# Patient Record
Sex: Male | Born: 1990 | Race: Black or African American | Hispanic: No | Marital: Single | Smoking: Current every day smoker
Health system: Southern US, Community
[De-identification: ages and names within clinical notes are randomized; demographics above are authoritative.]

---
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
## 2010-06-07 ENCOUNTER — Emergency Department: Payer: Self-pay | Admitting: Emergency Medicine

## 2012-06-04 ENCOUNTER — Emergency Department (HOSPITAL_COMMUNITY)
Admission: EM | Admit: 2012-06-04 | Discharge: 2012-06-04 | Disposition: A | Discharge status: Home / Self Care | Payer: BC Managed Care – PPO | Attending: Emergency Medicine | Admitting: Emergency Medicine

## 2012-06-04 ENCOUNTER — Encounter (HOSPITAL_COMMUNITY): Payer: Self-pay | Admitting: *Deleted

## 2012-06-04 DIAGNOSIS — Y92410 Unspecified street and highway as the place of occurrence of the external cause: Secondary | ICD-10-CM | POA: Insufficient documentation

## 2012-06-04 DIAGNOSIS — S20219A Contusion of unspecified front wall of thorax, initial encounter: Secondary | ICD-10-CM | POA: Insufficient documentation

## 2012-06-04 DIAGNOSIS — Z79899 Other long term (current) drug therapy: Secondary | ICD-10-CM | POA: Insufficient documentation

## 2012-06-04 DIAGNOSIS — S0990XA Unspecified injury of head, initial encounter: Secondary | ICD-10-CM | POA: Insufficient documentation

## 2012-06-04 DIAGNOSIS — S20212A Contusion of left front wall of thorax, initial encounter: Secondary | ICD-10-CM

## 2012-06-04 DIAGNOSIS — S161XXA Strain of muscle, fascia and tendon at neck level, initial encounter: Secondary | ICD-10-CM

## 2012-06-04 DIAGNOSIS — Y9389 Activity, other specified: Secondary | ICD-10-CM | POA: Insufficient documentation

## 2012-06-04 DIAGNOSIS — S139XXA Sprain of joints and ligaments of unspecified parts of neck, initial encounter: Secondary | ICD-10-CM | POA: Insufficient documentation

## 2012-06-04 MED ORDER — IBUPROFEN 800 MG PO TABS: 800.0000 mg | ORAL_TABLET | Freq: Three times a day (TID) | ORAL | Status: DC | PRN

## 2012-06-04 MED ORDER — IBUPROFEN 800 MG PO TABS
800.0000 mg | ORAL_TABLET | Freq: Once | ORAL | Status: AC
  Administered 2012-06-04: 800 mg via ORAL
  Filled 2012-06-04: qty 1

## 2012-06-04 MED ORDER — OXYCODONE-ACETAMINOPHEN 5-325 MG PO TABS: 1.0000 | ORAL_TABLET | Freq: Four times a day (QID) | ORAL | Status: DC | PRN

## 2012-06-04 MED ORDER — DIAZEPAM 5 MG PO TABS: 5.0000 mg | ORAL_TABLET | Freq: Two times a day (BID) | ORAL | Status: DC
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
## 2012-06-04 NOTE — ED Provider Notes (Signed)
Medical screening examination/treatment/procedure(s) were performed by non-physician practitioner and as supervising physician I was immediately available for consultation/collaboration.  Juliet Rude. Rubin Payor, MD 06/04/12 [phone]

## 2013-09-08 ENCOUNTER — Encounter (HOSPITAL_COMMUNITY): Payer: Self-pay | Admitting: Emergency Medicine

## 2013-09-08 ENCOUNTER — Emergency Department (HOSPITAL_COMMUNITY)
Admission: EM | Admit: 2013-09-08 | Discharge: 2013-09-08 | Disposition: A | Discharge status: Home / Self Care | Payer: Managed Care, Other (non HMO) | Attending: Emergency Medicine | Admitting: Emergency Medicine

## 2013-09-08 DIAGNOSIS — Z79899 Other long term (current) drug therapy: Secondary | ICD-10-CM | POA: Insufficient documentation

## 2013-09-08 DIAGNOSIS — S058X9A Other injuries of unspecified eye and orbit, initial encounter: Secondary | ICD-10-CM | POA: Insufficient documentation

## 2013-09-08 DIAGNOSIS — Y939 Activity, unspecified: Secondary | ICD-10-CM | POA: Insufficient documentation

## 2013-09-08 DIAGNOSIS — IMO0002 Reserved for concepts with insufficient information to code with codable children: Secondary | ICD-10-CM | POA: Insufficient documentation

## 2013-09-08 DIAGNOSIS — Y929 Unspecified place or not applicable: Secondary | ICD-10-CM | POA: Insufficient documentation

## 2013-09-08 DIAGNOSIS — S0501XA Injury of conjunctiva and corneal abrasion without foreign body, right eye, initial encounter: Secondary | ICD-10-CM

## 2013-09-08 MED ORDER — HYDROCODONE-ACETAMINOPHEN 5-325 MG PO TABS
2.0000 | ORAL_TABLET | Freq: Once | ORAL | Status: AC
  Administered 2013-09-08: 2 via ORAL
  Filled 2013-09-08: qty 2

## 2013-09-08 MED ORDER — CIPROFLOXACIN HCL 0.3 % OP SOLN
2.0000 [drp] | OPHTHALMIC | Status: DC
  Administered 2013-09-08: 2 [drp] via OPHTHALMIC
  Filled 2013-09-08: qty 2.5

## 2013-09-08 MED ORDER — IBUPROFEN 800 MG PO TABS
800.0000 mg | ORAL_TABLET | Freq: Once | ORAL | Status: AC
  Administered 2013-09-08: 800 mg via ORAL
  Filled 2013-09-08: qty 1

## 2013-09-08 MED ORDER — HYDROCODONE-ACETAMINOPHEN 5-325 MG PO TABS: 2.0000 | ORAL_TABLET | ORAL | Status: DC | PRN

## 2013-09-08 MED ORDER — PROPARACAINE HCL 0.5 % OP SOLN
2.0000 [drp] | Freq: Once | OPHTHALMIC | Status: AC
  Administered 2013-09-08: 2 [drp] via OPHTHALMIC
  Filled 2013-09-08: qty 15

## 2013-09-08 MED ORDER — FLUORESCEIN SODIUM 1 MG OP STRP
1.0000 | ORAL_STRIP | Freq: Once | OPHTHALMIC | Status: AC
Start: 2013-09-08 — End: 2013-09-08
  Administered 2013-09-08: 1 via OPHTHALMIC
  Filled 2013-09-08: qty 1

## 2013-09-08 NOTE — Discharge Instructions (Signed)
Take Tylenol and Ibuprofen for pain as needed For severe pain take norco or vicodin however realize they have the potential for addiction and it can make you sleepy and has tylenol in it.  No operating machinery while taking. See an ophthalmologist in 2 days for recheck.  If you were given medicines take as directed.  If you are on coumadin or contraceptives realize their levels and effectiveness is altered by many different medicines.  If you have any reaction (rash, tongues swelling, other) to the medicines stop taking and see a physician.   Please follow up as directed and return to the ER or see a physician for new or worsening symptoms.  Thank you. Filed Vitals:   09/08/13 0230  BP: 151/72  Pulse: 85  Temp: 98.2 F (36.8 C)  TempSrc: Oral  Height: 5\' 9"  (1.753 m)  Weight: 160 lb (72.576 kg)  SpO2: 100%    Corneal Abrasion The cornea is the clear covering at the front and center of the eye. When looking at the colored portion of the eye (iris), you are looking through the cornea. This very thin tissue is made up of many layers. The surface layer is a single layer of cells (corneal epithelium) and is one of the most sensitive tissues in the body. If a scratch or injury causes the corneal epithelium to come off, it is called a corneal abrasion. If the injury extends to the tissues below the epithelium, the condition is called a corneal ulcer. CAUSES   Scratches.  Trauma.  Foreign body in the eye. Some people have recurrences of abrasions in the area of the original injury even after it has healed (recurrent erosion syndrome). Recurrent erosion syndrome generally improves and goes away with time. SYMPTOMS   Eye pain.  Difficulty or inability to keep the injured eye open.  The eye becomes very sensitive to light.  Recurrent erosions tend to happen suddenly, first thing in the morning, usually after waking up and opening the eye. DIAGNOSIS  Your health care provider can diagnose  a corneal abrasion during an eye exam. Dye is usually placed in the eye using a drop or a small paper strip moistened by your tears. When the eye is examined with a special light, the abrasion shows up clearly because of the dye. TREATMENT   Small abrasions may be treated with antibiotic drops or ointment alone.  A pressure patch may be put over the eye. If this is done, follow your doctor's instructions for when to remove the patch. Do not drive or use machines while the eye patch is on. Judging distances is hard to do with a patch on. If the abrasion becomes infected and spreads to the deeper tissues of the cornea, a corneal ulcer can result. This is serious because it can cause corneal scarring. Corneal scars interfere with light passing through the cornea and cause a loss of vision in the involved eye. HOME CARE INSTRUCTIONS  Use medicine or ointment as directed. Only take over-the-counter or prescription medicines for pain, discomfort, or fever as directed by your health care provider.  Do not drive or operate machinery if your eye is patched. Your ability to judge distances is impaired.  If your health care provider has given you a follow-up appointment, it is very important to keep that appointment. Not keeping the appointment could result in a severe eye infection or permanent loss of vision. If there is any problem keeping the appointment, let your health care provider know.  SEEK MEDICAL CARE IF:   You have pain, light sensitivity, and a scratchy feeling in one eye or both eyes.  Your pressure patch keeps loosening up, and you can blink your eye under the patch after treatment.  Any kind of discharge develops from the eye after treatment or if the lids stick together in the morning.  You have the same symptoms in the morning as you did with the original abrasion days, weeks, or months after the abrasion healed. MAKE SURE YOU:   Understand these instructions.  Will watch your  condition.  Will get help right away if you are not doing well or get worse. Document Released: 02/25/2000 Document Revised: 03/04/2013 Document Reviewed: 11/04/2012 [facility] Patient Information 2015 [place], [place]. This information is not intended to replace advice given to you by your health care provider. Make sure you discuss any questions you have with your health care provider.
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
## 2015-08-18 ENCOUNTER — Emergency Department (HOSPITAL_COMMUNITY)
Admission: EM | Admit: 2015-08-18 | Discharge: 2015-08-18 | Disposition: A | Discharge status: Home / Self Care | Payer: Managed Care, Other (non HMO) | Attending: Emergency Medicine | Admitting: Emergency Medicine

## 2015-08-18 ENCOUNTER — Emergency Department (HOSPITAL_COMMUNITY): Payer: Managed Care, Other (non HMO)

## 2015-08-18 ENCOUNTER — Encounter (HOSPITAL_COMMUNITY): Payer: Self-pay | Admitting: *Deleted

## 2015-08-18 DIAGNOSIS — Y939 Activity, unspecified: Secondary | ICD-10-CM | POA: Diagnosis not present

## 2015-08-18 DIAGNOSIS — S99921A Unspecified injury of right foot, initial encounter: Secondary | ICD-10-CM | POA: Diagnosis present

## 2015-08-18 DIAGNOSIS — M79671 Pain in right foot: Secondary | ICD-10-CM | POA: Diagnosis not present

## 2015-08-18 DIAGNOSIS — W3189XA Contact with other specified machinery, initial encounter: Secondary | ICD-10-CM | POA: Diagnosis not present

## 2015-08-18 DIAGNOSIS — Y990 Civilian activity done for income or pay: Secondary | ICD-10-CM | POA: Diagnosis not present

## 2015-08-18 DIAGNOSIS — Y929 Unspecified place or not applicable: Secondary | ICD-10-CM | POA: Insufficient documentation

## 2015-08-18 DIAGNOSIS — Z79899 Other long term (current) drug therapy: Secondary | ICD-10-CM | POA: Insufficient documentation

## 2015-08-18 MED ORDER — NAPROXEN 500 MG PO TABS: 500.0000 mg | ORAL_TABLET | Freq: Two times a day (BID) | ORAL | Status: DC

## 2015-08-18 NOTE — ED Notes (Signed)
Declined W/C at D/C and was escorted to lobby by RN. 

## 2015-08-18 NOTE — ED Notes (Addendum)
Pt reports his foot was injured at work this AM.  Pt works at [employer] and a 1000lbs container rolled on his foot this AM.
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
## 2018-11-04 ENCOUNTER — Ambulatory Visit (HOSPITAL_COMMUNITY)
Admission: EM | Admit: 2018-11-04 | Discharge: 2018-11-04 | Disposition: A | Discharge status: Home / Self Care | Payer: 59 | Attending: Emergency Medicine | Admitting: Emergency Medicine

## 2018-11-04 ENCOUNTER — Other Ambulatory Visit: Payer: Self-pay

## 2018-11-04 ENCOUNTER — Encounter (HOSPITAL_COMMUNITY): Payer: Self-pay

## 2018-11-04 ENCOUNTER — Ambulatory Visit (INDEPENDENT_AMBULATORY_CARE_PROVIDER_SITE_OTHER): Payer: 59

## 2018-11-04 DIAGNOSIS — K219 Gastro-esophageal reflux disease without esophagitis: Secondary | ICD-10-CM | POA: Diagnosis not present

## 2018-11-04 MED ORDER — FAMOTIDINE 20 MG PO TABS: 20.0000 mg | ORAL_TABLET | Freq: Two times a day (BID) | ORAL | 0 refills | Status: AC

## 2018-11-04 MED ORDER — PANTOPRAZOLE SODIUM 20 MG PO TBEC: 20.0000 mg | DELAYED_RELEASE_TABLET | Freq: Every day | ORAL | 0 refills | Status: AC

## 2018-11-04 NOTE — Discharge Instructions (Addendum)
Your x-ray was normal.  You do not have an infection or swelling in your throat such as epiglottitis.  I suspect that this is acid reflux.  Start the [employer] and Protonix.  Go to the ER for the signs and symptoms we discussed.  Follow-up with a primary care physician, see list below.  Below is a list of primary care practices who are taking new patients for you to follow-up with.  [facility] at [facility] [address] [place], [place] [address] [phone]  [place], [place] [address] [phone]  [facility] [phone] [place] [address]  [facility]: [place]  [phone]  [place] [facility]: [place]   [phone]  [facility]: [address] [place]  [phone]  [place] [facility] : [address] [place] [phone]  [facility]: [address]  [place] [phone]  [facility]: [place] [phone]  Dr. Blanchie Serve [place]  [phone]  Dr. Benito Mccreedy, [facility]. [place], [place] [address]  [phone]  Go to www.goodrx.com to look up your medications. This will give you a list of where you can find your prescriptions at the most affordable prices. Or ask the pharmacist what the cash price is, or if they have any other discount programs available to help make your medication more affordable. This can be less expensive than what you would pay with insurance.

## 2018-11-04 NOTE — ED Triage Notes (Signed)
Pt states he was SOB last night. Pt states he started to feel better this morning but it started back this afternoon.

## 2018-11-04 NOTE — ED Provider Notes (Signed)
HPI  SUBJECTIVE:  Lee Washington is a 28 y.o. male who presents with neck tightness, a sensation of his throat being squeezed with resulting shortness of breath starting last night.  Feels like he can get enough air into his lungs, but has difficulty breathing because he feels as if his throat is swelling.  He woke up without any symptoms this morning, but states they returned this afternoon.  He denies taking a large bite of food and not chewing well.  No fevers, body aches, headache, sore throat, tongue or lip swelling, voice changes, neck stiffness, drooling.  He states that he has a hard time swallowing saliva but has no problems with liquids or foods.  No burning chest pain, water brash.  No rash, hives, pruritus, coughing.  Mild nasal congestion, no postnasal drip.  No abdominal pain, diarrhea, nausea, vomiting.  No exposure to COVID.  No trauma to the neck.  No stridor.  He does not take any medications, specifically ACE inhibitors, on a regular basis.  He has had symptoms like this before but never sought medical evaluation for this.  There are no aggravating or alleviating factors.  It is not associated with eating, drinking, talking.  He has not tried anything for this.  Past medical history negative for GERD.  He is an occasional smoker.  No history of hereditary angioedema.  PMD: None.   History reviewed. No pertinent past medical history.  History reviewed. No pertinent surgical history.  History reviewed. No pertinent family history.  Social History   Tobacco Use  . Smoking status: Current Every Day Smoker    Types: Cigarettes  . Smokeless tobacco: Never Used  Substance Use Topics  . Alcohol use: No  . Drug use: No    No current facility-administered medications for this encounter.   Current Outpatient Medications:  .  famotidine (PEPCID) 20 MG tablet, Take 1 tablet (20 mg total) by mouth 2 (two) times daily., Disp: 40 tablet, Rfl: 0 .  pantoprazole (PROTONIX) 20 MG tablet,  Take 1 tablet (20 mg total) by mouth daily., Disp: 30 tablet, Rfl: 0 .  sertraline (ZOLOFT) 100 MG tablet, Take 100 mg by mouth every morning., Disp: , Rfl:   No Known Allergies   ROS  As noted in HPI.   Physical Exam  BP 135/74 (BP Location: Right Arm)   Pulse 74   Temp 98.6 F (37 C) (Oral)   Resp 18   Wt 90.7 kg   SpO2 100%   BMI 29.53 kg/m   Constitutional: Well developed, well nourished, no acute distress.  Normal voice. Eyes:  EOMI, conjunctiva normal bilaterally HENT: Normocephalic, atraumatic,mucus membranes moist.  No angioedema of the lips or tongue.  Airway widely patent.  Normal tonsils without exudates, uvula midline.  No drooling, stridor, trismus. Neck: No cervical lymphadenopathy, neck stiffness.  Normal appearance.  No tenderness of the larynx.   Respiratory: Normal inspiratory effort, lungs clear bilaterally. Cardiovascular: Normal rate GI: nondistended skin: No rash, skin intact Musculoskeletal: no deformities Neurologic: Alert & oriented x 3, no focal neuro deficits Psychiatric: Speech and behavior appropriate   ED Course   Medications - No data to display  Orders Placed This Encounter  Procedures  . DG Neck Soft Tissue    Standing Status:   Standing    Number of Occurrences:   1    Order Specific Question:   Reason for Exam (SYMPTOM  OR DIAGNOSIS REQUIRED)    Answer:   r/o epiglottitis, FB,  anatomic abnormality    No results found for this or any previous visit (from the past 24 hour(s)). Dg Neck Soft Tissue  Result Date: 11/04/2018 CLINICAL DATA:  28 year old male with shortness of breath, query epiglottitis. EXAM: NECK SOFT TISSUES - 1+ VIEW COMPARISON:  None. FINDINGS: Pharyngeal and laryngeal soft tissue contours are within normal limits, including the epiglottis. Visualized tracheal air column is within normal limits. The superficial soft tissue contours appear symmetric. Negative visible upper chest. No osseous abnormality identified.  IMPRESSION: Negative. Electronically Signed   By: Odessa FlemingH  Hall M.D.   On: 11/04/2018 14:56    ED Clinical Impression  1. Gastroesophageal reflux disease, esophagitis presence not specified      ED Assessment/Plan  We will get a lateral neck to rule out epiglottitis, although this is low in the differential with the normal voice and absence of sore throat.  Suspect GERD.  will start him on some Pepcid, Protonix.  Will provide a primary care list for ongoing care.  Gave him ER return precautions.  Reviewed imaging independently.  Normal.  See radiology report for full details.  Discussed  imaging, MDM, treatment plan, and plan for follow-up with patient. Discussed sn/sx that should prompt return to the ED. patient agrees with plan.   Meds ordered this encounter  Medications  . famotidine (PEPCID) 20 MG tablet    Sig: Take 1 tablet (20 mg total) by mouth 2 (two) times daily.    Dispense:  40 tablet    Refill:  0  . pantoprazole (PROTONIX) 20 MG tablet    Sig: Take 1 tablet (20 mg total) by mouth daily.    Dispense:  30 tablet    Refill:  0    *This clinic note was created using Scientist, clinical (histocompatibility and immunogenetics)Dragon dictation software. Therefore, there may be occasional mistakes despite careful proofreading.   ?   Domenick GongMortenson, Yeimy Brabant, MD 11/05/18 1207

## 2020-04-25 IMAGING — DX NECK SOFT TISSUES - 1+ VIEW
2 series · 2 of 2 positions shown · non-contrast
Comparison: None.

CLINICAL DATA: 27-year-old male with shortness of breath, query
epiglottitis.

EXAM:
NECK SOFT TISSUES - 1+ VIEW

[neck lat]
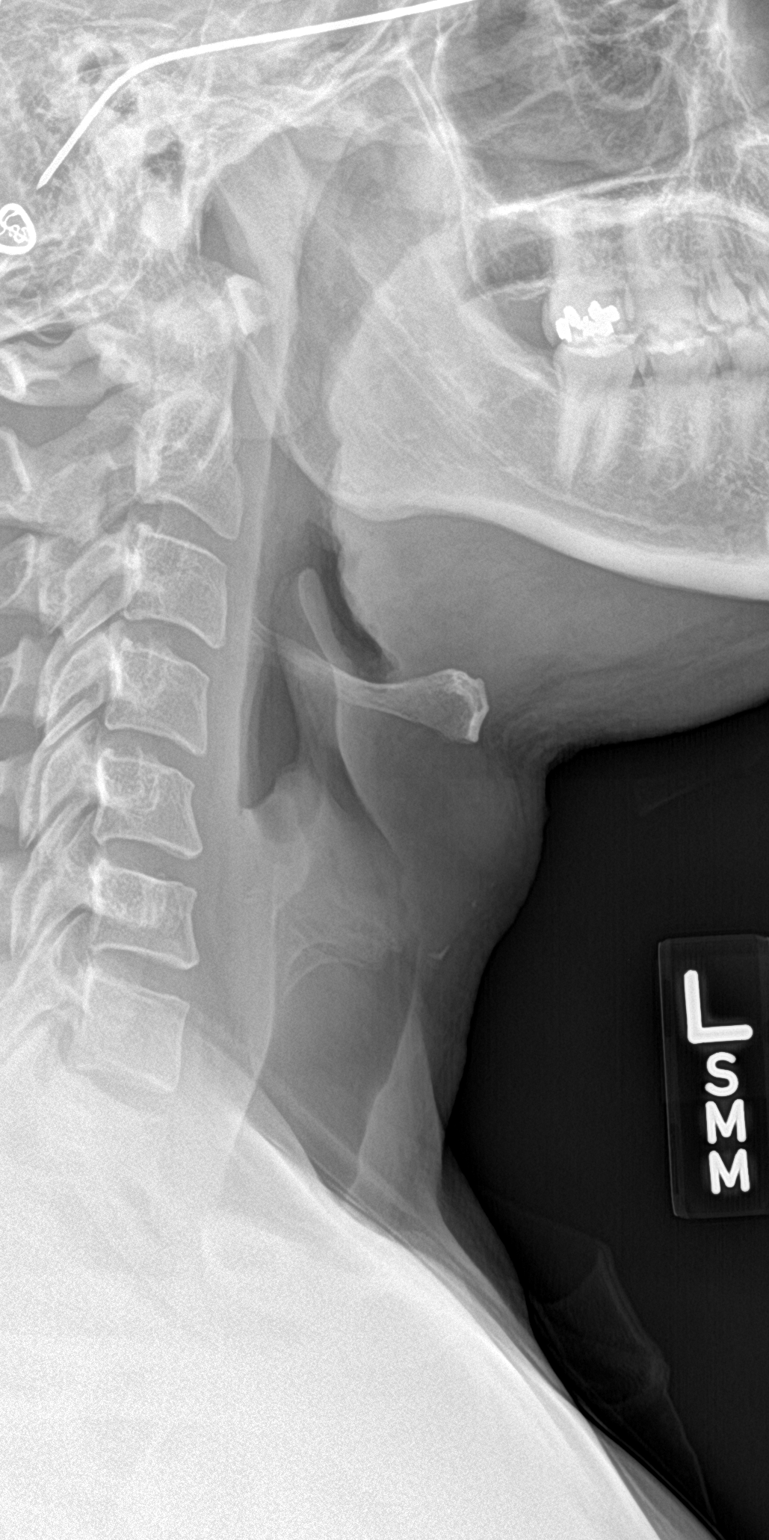

[neck ap]
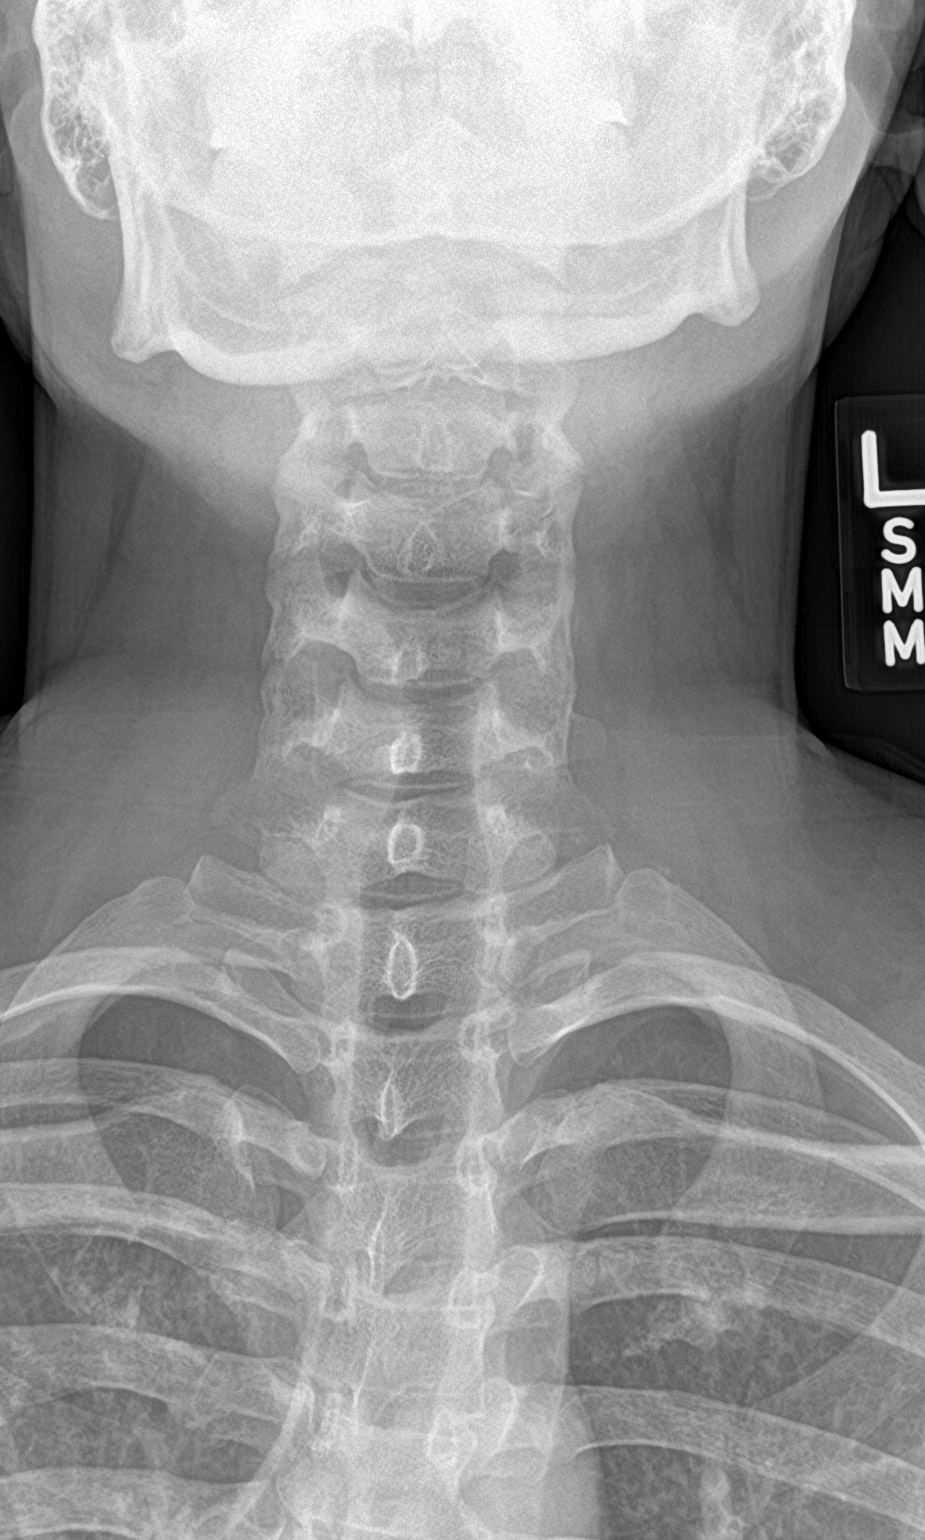

[2 of 2 positions shown; findings below may reference images not displayed]

FINDINGS: Pharyngeal and laryngeal soft tissue contours are within normal
limits, including the epiglottis. Visualized tracheal air column is
within normal limits.

The superficial soft tissue contours appear symmetric. Negative
visible upper chest. No osseous abnormality identified.
IMPRESSION: Negative.

## 2021-11-05 ENCOUNTER — Ambulatory Visit (HOSPITAL_COMMUNITY): Payer: Self-pay
